# Patient Record
Sex: Male | Born: 1980 | Race: White | Hispanic: No | Marital: Married | Smoking: Current every day smoker
Health system: Southern US, Community
[De-identification: ages and names within clinical notes are randomized; demographics above are authoritative.]

## PROBLEM LIST (undated history)

## (undated) ENCOUNTER — Telehealth

## (undated) ENCOUNTER — Ambulatory Visit: Payer: MEDICARE

## (undated) ENCOUNTER — Ambulatory Visit

## (undated) ENCOUNTER — Encounter: Attending: Specialist | Primary: Specialist

## (undated) ENCOUNTER — Ambulatory Visit: Payer: MEDICARE | Attending: Family | Primary: Family

## (undated) ENCOUNTER — Telehealth: Attending: Family | Primary: Family

## (undated) ENCOUNTER — Encounter

## (undated) ENCOUNTER — Other Ambulatory Visit

## (undated) ENCOUNTER — Ambulatory Visit: Payer: Medicare (Managed Care)

## (undated) ENCOUNTER — Ambulatory Visit: Attending: Physician Assistant | Primary: Physician Assistant

## (undated) ENCOUNTER — Encounter: Attending: Family | Primary: Family

## (undated) HISTORY — PX: BACK SURGERY: SHX140

---

## 2018-02-06 DIAGNOSIS — S32010S Wedge compression fracture of first lumbar vertebra, sequela: Secondary | ICD-10-CM | POA: Insufficient documentation

## 2018-02-06 DIAGNOSIS — S32000A Wedge compression fracture of unspecified lumbar vertebra, initial encounter for closed fracture: Secondary | ICD-10-CM | POA: Insufficient documentation

## 2018-06-07 ENCOUNTER — Ambulatory Visit: Admit: 2018-06-07 | Discharge: 2018-06-08 | Payer: MEDICARE

## 2018-07-02 ENCOUNTER — Emergency Department: Payer: Medicare Other

## 2018-07-02 ENCOUNTER — Other Ambulatory Visit: Payer: Self-pay

## 2018-07-02 ENCOUNTER — Encounter: Payer: Self-pay | Admitting: Emergency Medicine

## 2018-07-02 ENCOUNTER — Emergency Department
Admission: EM | Admit: 2018-07-02 | Discharge: 2018-07-02 | Disposition: A | Discharge status: Home / Self Care | Payer: Medicare Other | Attending: Student in an Organized Health Care Education/Training Program | Admitting: Student in an Organized Health Care Education/Training Program

## 2018-07-02 DIAGNOSIS — T84226A Displacement of internal fixation device of vertebrae, initial encounter: Secondary | ICD-10-CM | POA: Insufficient documentation

## 2018-07-02 DIAGNOSIS — T84498A Other mechanical complication of other internal orthopedic devices, implants and grafts, initial encounter: Secondary | ICD-10-CM

## 2018-07-02 DIAGNOSIS — Y658 Other specified misadventures during surgical and medical care: Secondary | ICD-10-CM | POA: Diagnosis not present

## 2018-07-02 DIAGNOSIS — M48061 Spinal stenosis, lumbar region without neurogenic claudication: Secondary | ICD-10-CM

## 2018-07-02 DIAGNOSIS — F17210 Nicotine dependence, cigarettes, uncomplicated: Secondary | ICD-10-CM | POA: Insufficient documentation

## 2018-07-02 DIAGNOSIS — M5441 Lumbago with sciatica, right side: Secondary | ICD-10-CM | POA: Diagnosis not present

## 2018-07-02 DIAGNOSIS — M545 Low back pain: Secondary | ICD-10-CM | POA: Diagnosis present

## 2018-07-02 DIAGNOSIS — S32010A Wedge compression fracture of first lumbar vertebra, initial encounter for closed fracture: Secondary | ICD-10-CM

## 2018-07-02 DIAGNOSIS — G8929 Other chronic pain: Secondary | ICD-10-CM

## 2018-07-02 MED ORDER — METHOCARBAMOL 500 MG PO TABS: 500.0000 mg | ORAL_TABLET | Freq: Four times a day (QID) | ORAL | 0 refills | Status: DC

## 2018-07-02 MED ORDER — KETOROLAC TROMETHAMINE 30 MG/ML IJ SOLN
30.0000 mg | Freq: Once | INTRAMUSCULAR | Status: AC
  Administered 2018-07-02: 30 mg via INTRAMUSCULAR
  Filled 2018-07-02: qty 1

## 2018-07-02 MED ORDER — OXYCODONE-ACETAMINOPHEN 5-325 MG PO TABS
1.0000 | ORAL_TABLET | Freq: Once | ORAL | Status: AC
  Administered 2018-07-02: 1 via ORAL
  Filled 2018-07-02: qty 1

## 2018-07-02 MED ORDER — DEXAMETHASONE SODIUM PHOSPHATE 10 MG/ML IJ SOLN
10.0000 mg | Freq: Once | INTRAMUSCULAR | Status: AC
  Administered 2018-07-02: 10 mg via INTRAMUSCULAR
  Filled 2018-07-02: qty 1

## 2018-07-02 MED ORDER — MELOXICAM 15 MG PO TABS: 15.0000 mg | ORAL_TABLET | Freq: Every day | ORAL | 0 refills | Status: DC

## 2018-07-02 MED ORDER — HYDROMORPHONE HCL 1 MG/ML IJ SOLN
1.0000 mg | Freq: Once | INTRAMUSCULAR | Status: AC
  Administered 2018-07-02: 1 mg via INTRAMUSCULAR
  Filled 2018-07-02: qty 1

## 2018-07-02 MED ORDER — ORPHENADRINE CITRATE 30 MG/ML IJ SOLN
60.0000 mg | Freq: Once | INTRAMUSCULAR | Status: AC
  Administered 2018-07-02: 60 mg via INTRAMUSCULAR
  Filled 2018-07-02: qty 2

## 2018-07-02 MED ORDER — PREDNISONE 10 MG PO TABS: 10.0000 mg | ORAL_TABLET | Freq: Every day | ORAL | 0 refills | Status: DC
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
## 2018-07-02 NOTE — ED Notes (Signed)
Patient transported to MRI 

## 2019-03-05 DIAGNOSIS — S81801A Unspecified open wound, right lower leg, initial encounter: Principal | ICD-10-CM

## 2019-03-06 ENCOUNTER — Emergency Department: Admit: 2019-03-06 | Discharge: 2019-03-06 | Disposition: A | Discharge status: Home / Self Care | Payer: MEDICARE

## 2019-03-06 ENCOUNTER — Ambulatory Visit: Admit: 2019-03-06 | Discharge: 2019-03-06 | Disposition: A | Discharge status: Home / Self Care | Payer: MEDICARE

## 2019-06-07 ENCOUNTER — Ambulatory Visit
Admit: 2019-06-07 | Discharge: 2019-06-07 | Disposition: A | Discharge status: Home / Self Care | Payer: MEDICARE | Attending: Emergency Medicine

## 2019-06-07 ENCOUNTER — Emergency Department
Admit: 2019-06-07 | Discharge: 2019-06-07 | Disposition: A | Discharge status: Home / Self Care | Payer: MEDICARE | Attending: Emergency Medicine

## 2019-06-07 DIAGNOSIS — Z4801 Encounter for change or removal of surgical wound dressing: Secondary | ICD-10-CM

## 2019-06-07 DIAGNOSIS — L0390 Cellulitis, unspecified: Secondary | ICD-10-CM

## 2019-06-07 DIAGNOSIS — F17210 Nicotine dependence, cigarettes, uncomplicated: Secondary | ICD-10-CM

## 2019-06-07 MED ORDER — CLINDAMYCIN HCL 150 MG CAPSULE
ORAL_CAPSULE | Freq: Four times a day (QID) | ORAL | 0 refills | 7.00000 days | Status: CP
Start: 2019-06-07 — End: 2019-06-14

## 2019-06-19 IMAGING — CR DG LUMBAR SPINE COMPLETE 4+V
1 series · 5 of 5 positions shown · non-contrast
Comparison: None.

CLINICAL DATA: Worsening lumbar pain

EXAM:
LUMBAR SPINE - COMPLETE 4+ VIEW

[Series 1: dg lumbar spine complete 4 +v · 0.14mm/px · 5 of 5 slices shown]
[im 1/5]
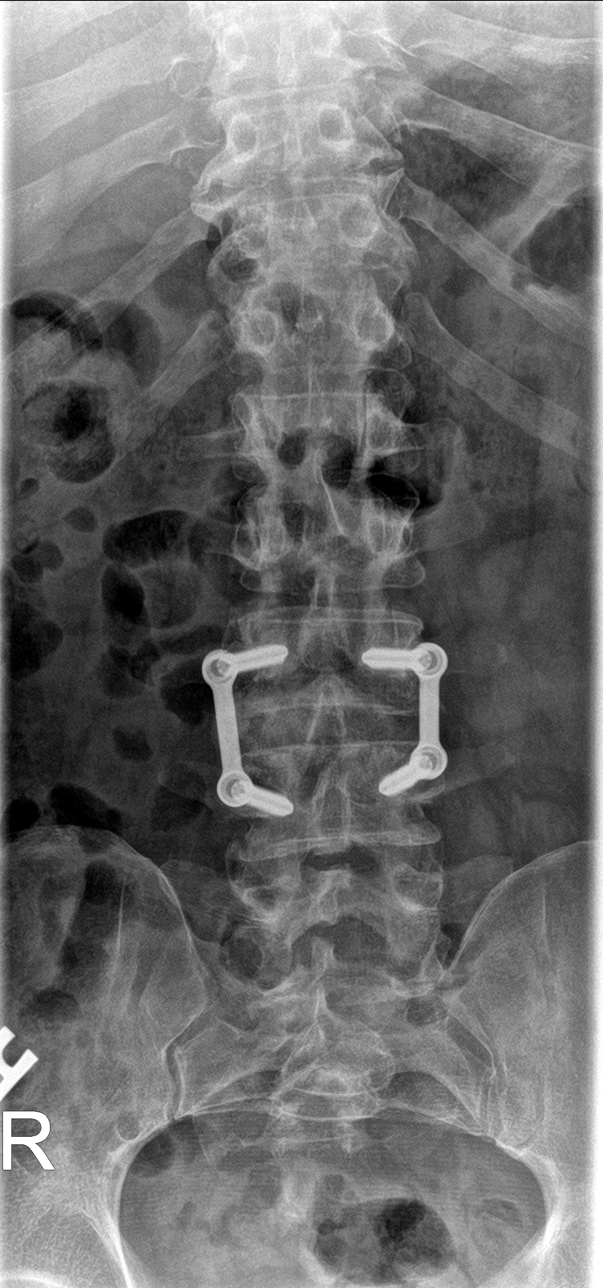
[im 2/5]
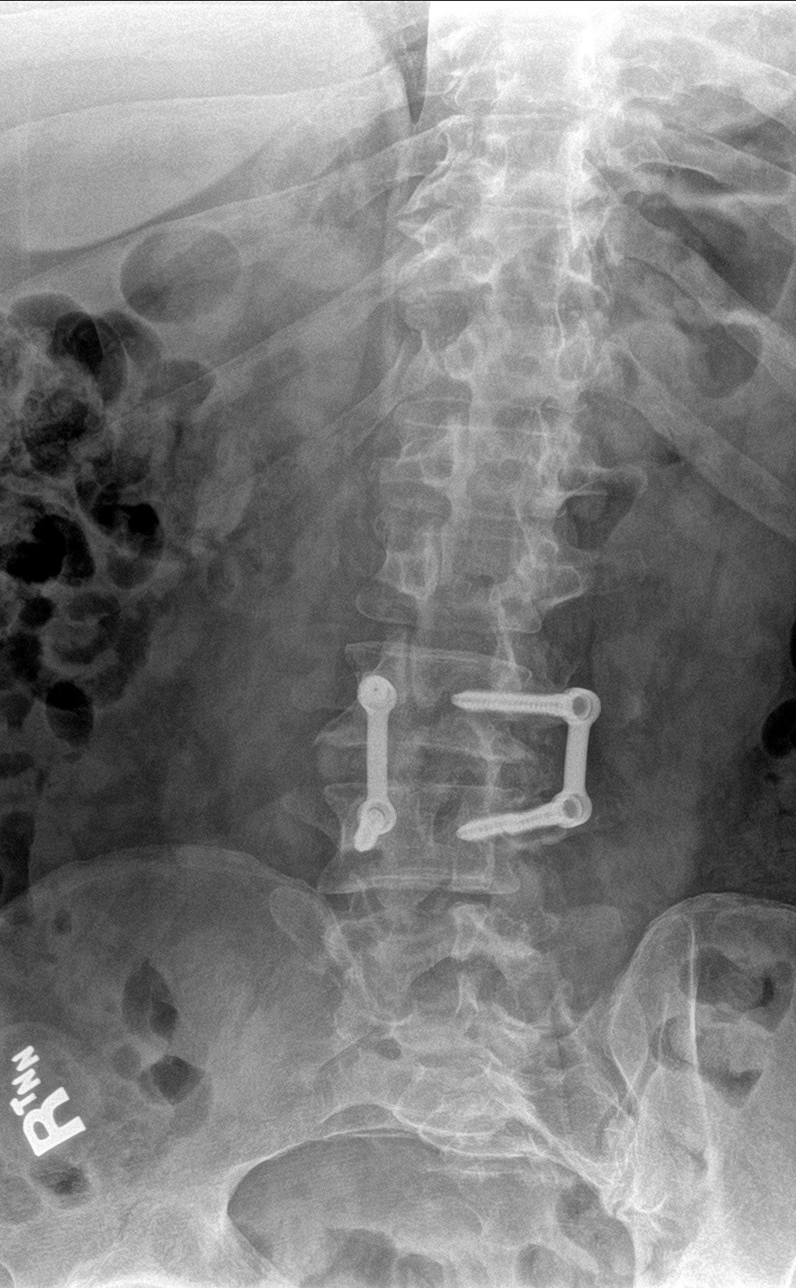
[im 3/5]
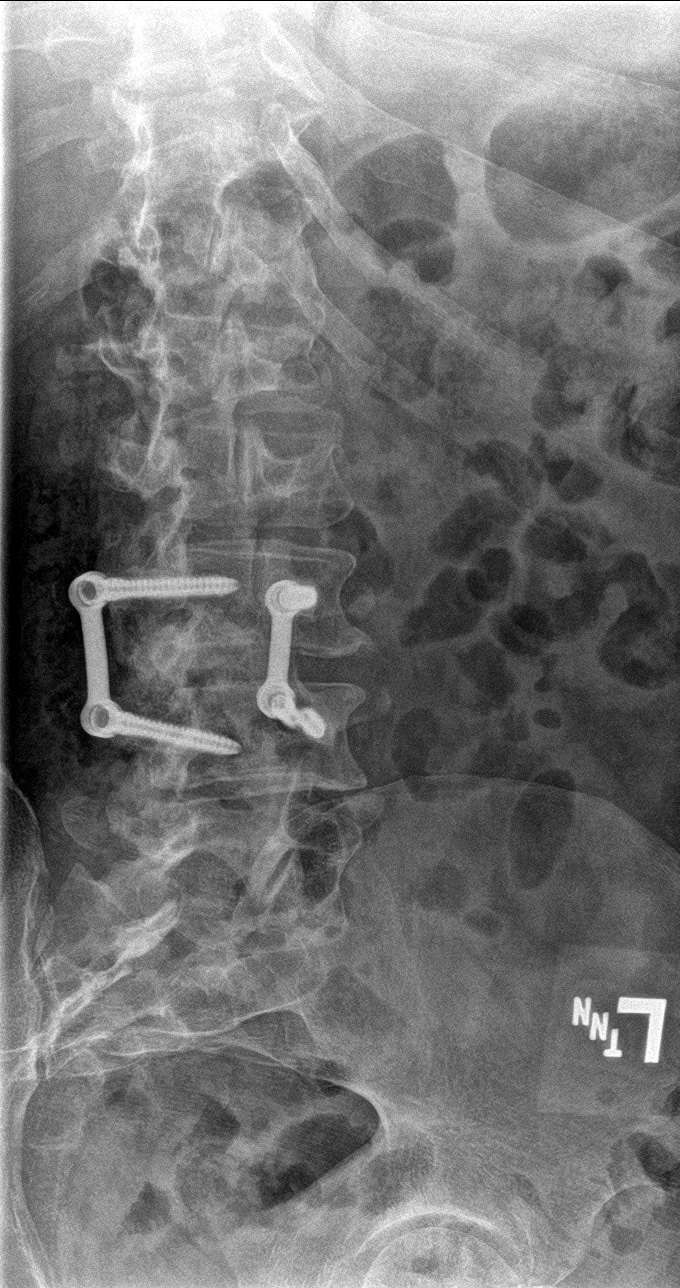
[im 4/5]
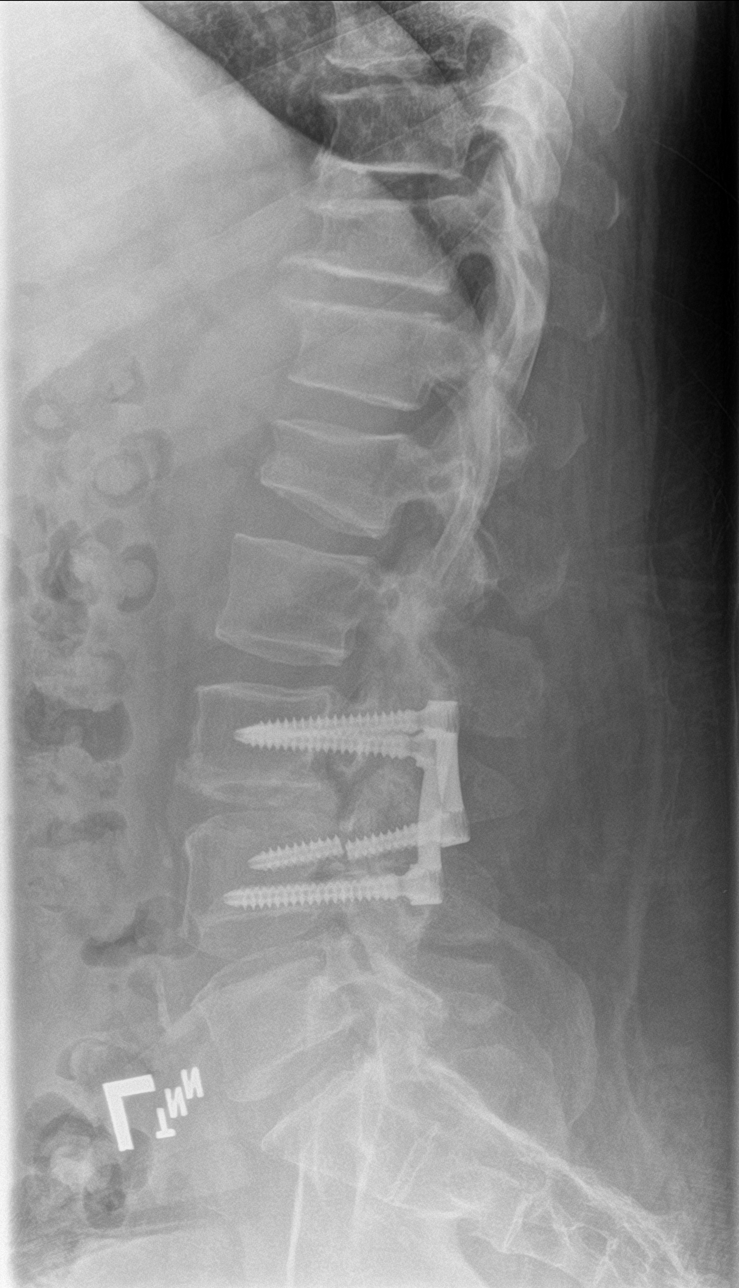
[im 5/5]
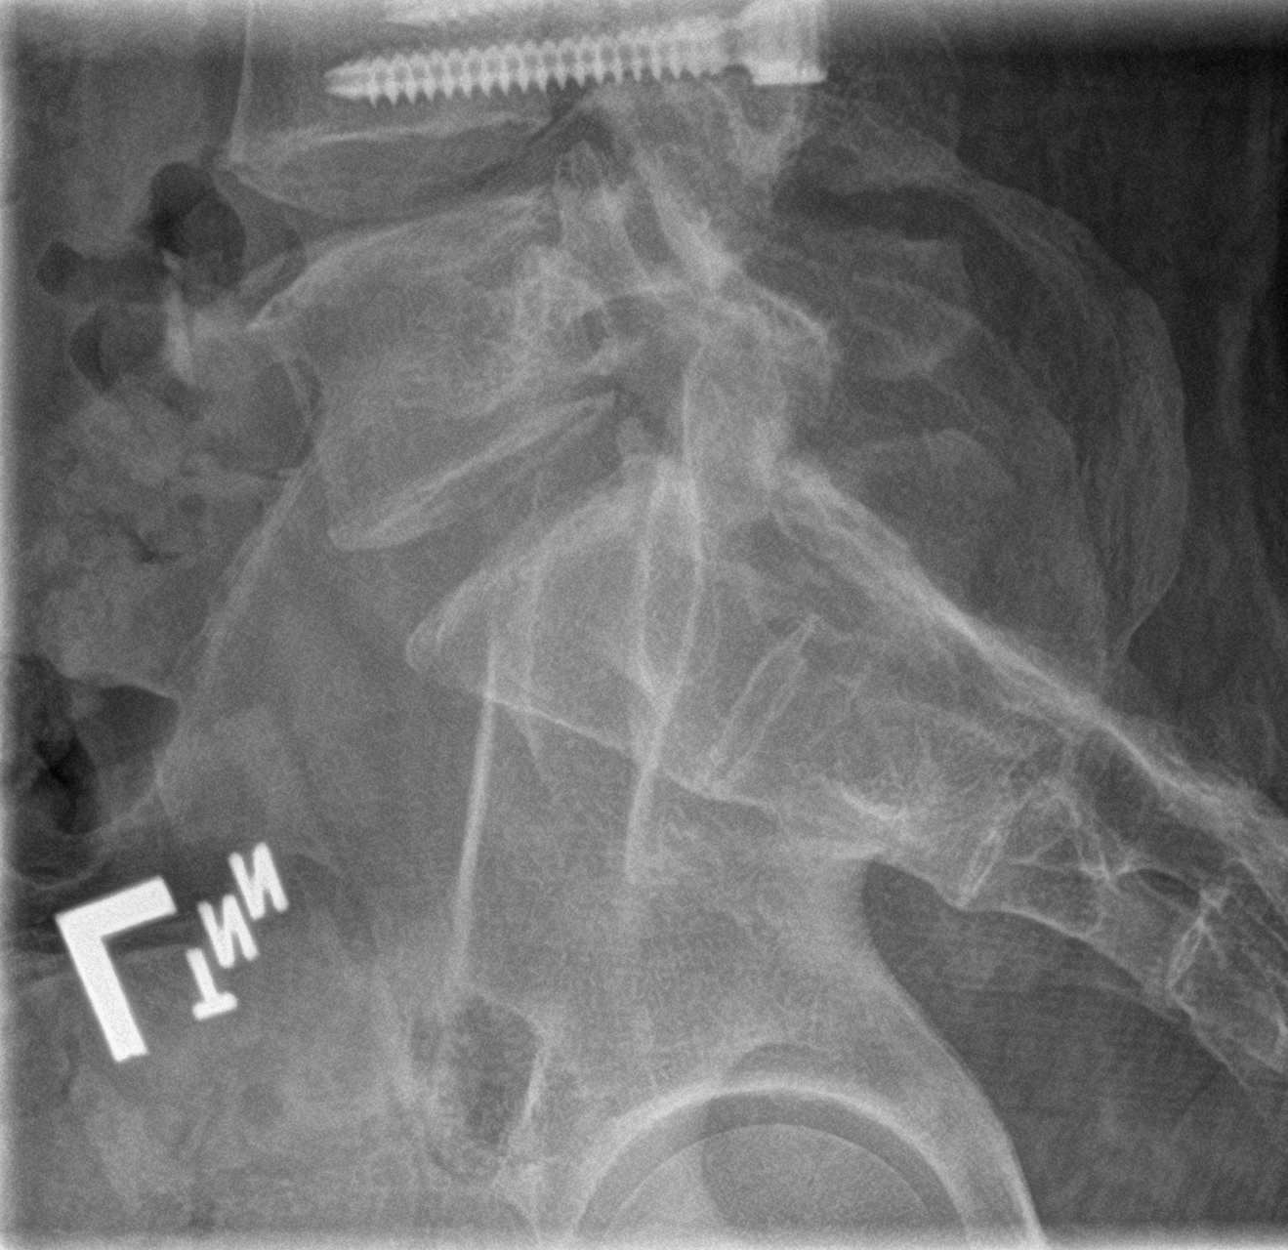

[5 of 5 positions shown; findings below may reference images not displayed]

FINDINGS: Five non rib-bearing lumbar type vertebra. Status post posterior
rods and screws at L3-L4. Fracture through 1 of the L4 fixating
screws, left-sided. Lucency around the left L4 screw consistent with
loosening. Mild wedge compression deformity L1, uncertain age.
Remaining lumbar vertebra demonstrate normal stature. Mild
degenerative change at L5-S1.
IMPRESSION: 1. Mild to moderate compression deformity L1 of uncertain age
2. Postsurgical changes at L3-L4 with fracture through left fixating
screw at L4, there is lucency surrounding the left L4 fixating
screw.

## 2020-07-30 DIAGNOSIS — G114 Hereditary spastic paraplegia: Secondary | ICD-10-CM | POA: Insufficient documentation

## 2020-07-30 DIAGNOSIS — E119 Type 2 diabetes mellitus without complications: Secondary | ICD-10-CM | POA: Insufficient documentation

## 2020-07-30 DIAGNOSIS — F419 Anxiety disorder, unspecified: Secondary | ICD-10-CM | POA: Insufficient documentation

## 2020-07-30 DIAGNOSIS — F17210 Nicotine dependence, cigarettes, uncomplicated: Secondary | ICD-10-CM | POA: Insufficient documentation

## 2021-04-21 DIAGNOSIS — E559 Vitamin D deficiency, unspecified: Secondary | ICD-10-CM | POA: Insufficient documentation

## 2021-04-21 DIAGNOSIS — S81801A Unspecified open wound, right lower leg, initial encounter: Secondary | ICD-10-CM | POA: Insufficient documentation

## 2021-04-21 DIAGNOSIS — S81802A Unspecified open wound, left lower leg, initial encounter: Secondary | ICD-10-CM | POA: Insufficient documentation

## 2021-04-21 DIAGNOSIS — G8929 Other chronic pain: Secondary | ICD-10-CM | POA: Insufficient documentation

## 2021-10-31 ENCOUNTER — Ambulatory Visit
Admit: 2021-10-31 | Discharge: 2021-11-01 | Disposition: A | Discharge status: Home / Self Care | Payer: MEDICARE | Attending: Emergency Medicine

## 2021-10-31 DIAGNOSIS — F1910 Other psychoactive substance abuse, uncomplicated: Principal | ICD-10-CM

## 2021-10-31 DIAGNOSIS — R7401 Transaminitis: Principal | ICD-10-CM

## 2021-10-31 DIAGNOSIS — E1065 Type 1 diabetes mellitus with hyperglycemia: Principal | ICD-10-CM

## 2021-10-31 DIAGNOSIS — T24212A Burn of second degree of left thigh, initial encounter: Principal | ICD-10-CM

## 2022-08-10 DIAGNOSIS — B1920 Unspecified viral hepatitis C without hepatic coma: Principal | ICD-10-CM

## 2022-09-08 ENCOUNTER — Ambulatory Visit: Admit: 2022-09-08 | Discharge: 2022-09-10 | Payer: MEDICARE

## 2022-09-08 DIAGNOSIS — L97909 Non-pressure chronic ulcer of unspecified part of unspecified lower leg with unspecified severity: Secondary | ICD-10-CM | POA: Insufficient documentation

## 2022-09-08 DIAGNOSIS — R739 Hyperglycemia, unspecified: Secondary | ICD-10-CM | POA: Insufficient documentation

## 2022-09-08 DIAGNOSIS — E1165 Type 2 diabetes mellitus with hyperglycemia: Secondary | ICD-10-CM | POA: Insufficient documentation

## 2022-09-08 DIAGNOSIS — L03119 Cellulitis of unspecified part of limb: Secondary | ICD-10-CM | POA: Insufficient documentation

## 2022-09-10 MED ORDER — INSULIN DETEMIR (U-100) 100 UNIT/ML (3 ML) SUBCUTANEOUS PEN
Freq: Every evening | SUBCUTANEOUS | 0 refills | 30 days | Status: CP
Start: 2022-09-10 — End: 2022-10-10

## 2022-09-10 MED ORDER — DOXYCYCLINE HYCLATE 100 MG CAPSULE
ORAL_CAPSULE | Freq: Two times a day (BID) | ORAL | 0 refills | 7 days | Status: CP
Start: 2022-09-10 — End: 2022-09-17

## 2022-09-10 MED ORDER — GLIPIZIDE 10 MG TABLET
ORAL_TABLET | Freq: Two times a day (BID) | ORAL | 0 refills | 30 days | Status: CP
Start: 2022-09-10 — End: 2022-10-10

## 2022-10-17 DIAGNOSIS — Z789 Other specified health status: Secondary | ICD-10-CM | POA: Insufficient documentation

## 2022-10-17 DIAGNOSIS — M899 Disorder of bone, unspecified: Secondary | ICD-10-CM | POA: Insufficient documentation

## 2022-10-17 DIAGNOSIS — G894 Chronic pain syndrome: Secondary | ICD-10-CM | POA: Insufficient documentation

## 2022-10-17 DIAGNOSIS — Z79899 Other long term (current) drug therapy: Secondary | ICD-10-CM | POA: Insufficient documentation
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
## 2022-10-18 ENCOUNTER — Ambulatory Visit (HOSPITAL_BASED_OUTPATIENT_CLINIC_OR_DEPARTMENT_OTHER): Payer: 59 | Admitting: Pain Medicine

## 2022-10-18 ENCOUNTER — Ambulatory Visit
Admission: RE | Admit: 2022-10-18 | Discharge: 2022-10-18 | Disposition: A | Discharge status: Home / Self Care | Payer: 59 | Source: Ambulatory Visit | Attending: Pain Medicine | Admitting: Pain Medicine

## 2022-10-18 ENCOUNTER — Encounter: Payer: Self-pay | Admitting: Pain Medicine

## 2022-10-18 VITALS — BP 147/86 | HR 102 | Temp 97.4°F | Resp 18 | Ht 67.0 in | Wt 175.0 lb

## 2022-10-18 DIAGNOSIS — M25612 Stiffness of left shoulder, not elsewhere classified: Secondary | ICD-10-CM

## 2022-10-18 DIAGNOSIS — G8929 Other chronic pain: Secondary | ICD-10-CM | POA: Insufficient documentation

## 2022-10-18 DIAGNOSIS — M5441 Lumbago with sciatica, right side: Secondary | ICD-10-CM

## 2022-10-18 DIAGNOSIS — F40298 Other specified phobia: Secondary | ICD-10-CM

## 2022-10-18 DIAGNOSIS — M5442 Lumbago with sciatica, left side: Secondary | ICD-10-CM

## 2022-10-18 DIAGNOSIS — G8911 Acute pain due to trauma: Secondary | ICD-10-CM

## 2022-10-18 DIAGNOSIS — M25512 Pain in left shoulder: Secondary | ICD-10-CM | POA: Diagnosis present

## 2022-10-18 DIAGNOSIS — M961 Postlaminectomy syndrome, not elsewhere classified: Secondary | ICD-10-CM | POA: Insufficient documentation

## 2022-10-18 DIAGNOSIS — R0781 Pleurodynia: Secondary | ICD-10-CM

## 2022-10-18 DIAGNOSIS — R0789 Other chest pain: Secondary | ICD-10-CM

## 2022-10-18 DIAGNOSIS — R937 Abnormal findings on diagnostic imaging of other parts of musculoskeletal system: Secondary | ICD-10-CM | POA: Insufficient documentation

## 2022-10-18 DIAGNOSIS — Z981 Arthrodesis status: Secondary | ICD-10-CM | POA: Insufficient documentation

## 2022-10-18 DIAGNOSIS — Z79899 Other long term (current) drug therapy: Secondary | ICD-10-CM | POA: Insufficient documentation

## 2022-10-18 DIAGNOSIS — S32010S Wedge compression fracture of first lumbar vertebra, sequela: Secondary | ICD-10-CM

## 2022-10-18 DIAGNOSIS — G114 Hereditary spastic paraplegia: Secondary | ICD-10-CM

## 2022-10-18 DIAGNOSIS — T50905S Adverse effect of unspecified drugs, medicaments and biological substances, sequela: Secondary | ICD-10-CM | POA: Insufficient documentation

## 2022-10-18 DIAGNOSIS — M899 Disorder of bone, unspecified: Secondary | ICD-10-CM | POA: Insufficient documentation

## 2022-10-18 DIAGNOSIS — G894 Chronic pain syndrome: Secondary | ICD-10-CM

## 2022-10-18 DIAGNOSIS — M79605 Pain in left leg: Secondary | ICD-10-CM

## 2022-10-18 DIAGNOSIS — M79604 Pain in right leg: Secondary | ICD-10-CM

## 2022-10-18 DIAGNOSIS — F1190 Opioid use, unspecified, uncomplicated: Secondary | ICD-10-CM | POA: Insufficient documentation

## 2022-10-18 DIAGNOSIS — Z789 Other specified health status: Secondary | ICD-10-CM

## 2022-10-18 DIAGNOSIS — R252 Cramp and spasm: Secondary | ICD-10-CM | POA: Insufficient documentation

## 2022-10-18 NOTE — Patient Instructions (Signed)
____________________________________________________________________________________________  New Patients  Welcome to [facility] [place].   Initial Visit The first or initial visit consists of an evaluation only.   Interventional pain management.  We offer therapies other than opioid controlled substances to manage chronic pain. These include, but are not limited to, diagnostic, therapeutic, and palliative specialized injection therapies (i.e.: Epidural Steroids, Facet Blocks, etc.). We specialize in a variety of nerve blocks as well as radiofrequency treatments. We offer pain implant evaluations and trials, as well as follow up management. In addition we also provide a variety joint injections, including Viscosupplementation (AKA: Gel Therapy).  Prescription Pain Medication. We specialize in alternatives to opioids. We can provide evaluations and recommendations for/of pharmacologic therapies based on CDC Guidelines.  We no longer take patients for long-term medication management. We will not be taking over your pain medications.  ____________________________________________________________________________________________    ____________________________________________________________________________________________  Patient Information update  To: All of our patients.  Re: Name change.  It has been made official that our current name, "[facility]"   will soon be changed to "[facility]".   The purpose of this change is to eliminate any confusion created by the concept of our practice being a "Medication Management Pain [facility]". In the past this has led to the misconception that we treat pain primarily by the use of prescription medications.  Nothing can be farther from the truth.   Understanding PAIN MANAGEMENT: To  further understand what our practice does, you first have to understand that "Pain Management" is a subspecialty that requires additional training once a physician has completed their specialty training, which can be in either Anesthesia, Neurology, Psychiatry, or Physical Medicine and Rehabilitation (PMR). Each one of these contributes to the final approach taken by each physician to the management of their patient's pain. To be a "Pain Management Specialist" you must have first completed one of the specialty trainings below.  Anesthesiologists - trained in clinical pharmacology and interventional techniques such as nerve blockade and regional as well as central neuroanatomy. They are trained to block pain before, during, and after surgical interventions.  Neurologists - trained in the diagnosis and pharmacological treatment of complex neurological conditions, such as Multiple Sclerosis, Parkinson's, spinal cord injuries, and other systemic conditions that may be associated with symptoms that may include but are not limited to pain. They tend to rely primarily on the treatment of chronic pain using prescription medications.  Psychiatrist - trained in conditions affecting the psychosocial wellbeing of patients including but not limited to depression, anxiety, schizophrenia, personality disorders, addiction, and other substance use disorders that may be associated with chronic pain. They tend to rely primarily on the treatment of chronic pain using prescription medications.   Physical Medicine and Rehabilitation (PMR) physicians, also known as physiatrists - trained to treat a wide variety of medical conditions affecting the brain, spinal cord, nerves, bones, joints, ligaments, muscles, and tendons. Their training is primarily aimed at treating patients that have suffered injuries that have caused severe physical impairment. Their training is primarily aimed at the physical therapy and rehabilitation of those  patients. They may also work alongside orthopedic surgeons or neurosurgeons using their expertise in assisting surgical patients to recover after their surgeries.  INTERVENTIONAL PAIN MANAGEMENT is sub-subspecialty of Pain Management.  Our physicians are Board-certified in Anesthesia, Pain Management, and Interventional Pain Management.  This meaning that not only have they been trained  and Board-certified in their specialty of Anesthesia, and subspecialty of Pain Management, but they have also received further training in the sub-subspecialty of Interventional Pain Management, in order to become Board-certified as INTERVENTIONAL PAIN MANAGEMENT SPECIALIST.    Mission: Our goal is to use our skills in  [place] as alternatives to the chronic use of prescription opioid medications for the treatment of pain. To make this more clear, we have changed our name to reflect what we do and offer. We will continue to offer medication management assessment and recommendations, but we will not be taking over any patient's medication management.  ____________________________________________________________________________________________     ____________________________________________________________________________________________  General Risks and Possible Complications  Patient Responsibilities: It is important that you read this as it is part of your informed consent. It is our duty to inform you of the risks and possible complications associated with treatments offered to you. It is your responsibility as a patient to read this and to ask questions about anything that is not clear or that you believe was not covered in this document.  Patient's Rights: You have the right to refuse treatment. You also have the right to change your mind, even after initially having agreed to have the treatment done. However, under this last option, if you wait until the last second to change your mind,  you may be charged for the materials used up to that point.  Introduction: Medicine is not an Chief Strategy Officer. Everything in Medicine, including the lack of treatment(s), carries the potential for danger, harm, or loss (which is by definition: Risk). In Medicine, a complication is a secondary problem, condition, or disease that can aggravate an already existing one. All treatments carry the risk of possible complications. The fact that a side effects or complications occurs, does not imply that the treatment was conducted incorrectly. It must be clearly understood that these can happen even when everything is done following the highest safety standards.  No treatment: You can choose not to proceed with the proposed treatment alternative. The "PRO(s)" would include: avoiding the risk of complications associated with the therapy. The "CON(s)" would include: not getting any of the treatment benefits. These benefits fall under one of three categories: diagnostic; therapeutic; and/or palliative. Diagnostic benefits include: getting information which can ultimately lead to improvement of the disease or symptom(s). Therapeutic benefits are those associated with the successful treatment of the disease. Finally, palliative benefits are those related to the decrease of the primary symptoms, without necessarily curing the condition (example: decreasing the pain from a flare-up of a chronic condition, such as incurable terminal cancer).  General Risks and Complications: These are associated to most interventional treatments. They can occur alone, or in combination. They fall under one of the following six (6) categories: no benefit or worsening of symptoms; bleeding; infection; nerve damage; allergic reactions; and/or death. No benefits or worsening of symptoms: In Medicine there are no guarantees, only probabilities. No healthcare provider can ever guarantee that a medical treatment will work, they can only state the  probability that it may. Furthermore, there is always the possibility that the condition may worsen, either directly, or indirectly, as a consequence of the treatment. Bleeding: This is more common if the patient is taking a blood thinner, either prescription or over the counter (example: Goody Powders, Fish oil, Aspirin, Garlic, etc.), or if suffering a condition associated with impaired coagulation (example: Hemophilia, cirrhosis of the liver, low platelet counts, etc.). However, even if you do not have  one on these, it can still happen. If you have any of these conditions, or take one of these drugs, make sure to notify your treating physician. Infection: This is more common in patients with a compromised immune system, either due to disease (example: diabetes, cancer, human immunodeficiency virus [HIV], etc.), or due to medications or treatments (example: therapies used to treat cancer and rheumatological diseases). However, even if you do not have one on these, it can still happen. If you have any of these conditions, or take one of these drugs, make sure to notify your treating physician. Nerve Damage: This is more common when the treatment is an invasive one, but it can also happen with the use of medications, such as those used in the treatment of cancer. The damage can occur to small secondary nerves, or to large primary ones, such as those in the spinal cord and brain. This damage may be temporary or permanent and it may lead to impairments that can range from temporary numbness to permanent paralysis and/or brain death. Allergic Reactions: Any time a substance or material comes in contact with our body, there is the possibility of an allergic reaction. These can range from a mild skin rash (contact dermatitis) to a severe systemic reaction (anaphylactic reaction), which can result in death. Death: In general, any medical intervention can result in death, most of the time due to an unforeseen  complication. ____________________________________________________________________________________________

## 2022-10-19 ENCOUNTER — Telehealth: Payer: Self-pay | Admitting: *Deleted

## 2022-10-19 NOTE — Telephone Encounter (Signed)
Spoke with patient regarding critical glucose level of 548. Per Dr. Dossie Arbour he needs to see PCP today. States he will call Elsie Sa[facility] PCP for appointment.

## 2022-10-20 LAB — COMPLIANCE DRUG ANALYSIS, UR

## 2022-10-27 LAB — COMP. METABOLIC PANEL (12)
AST: 122 IU/L — ABNORMAL HIGH (ref 0–40)
Albumin/Globulin Ratio: 1.3 (ref 1.2–2.2)
Albumin: 3.8 g/dL — ABNORMAL LOW (ref 4.1–5.1)
Alkaline Phosphatase: 161 IU/L — ABNORMAL HIGH (ref 44–121)
BUN/Creatinine Ratio: 22 — ABNORMAL HIGH (ref 9–20)
BUN: 15 mg/dL (ref 6–24)
Bilirubin Total: 0.4 mg/dL (ref 0.0–1.2)
Calcium: 9.7 mg/dL (ref 8.7–10.2)
Chloride: 99 mmol/L (ref 96–106)
Creatinine, Ser: 0.67 mg/dL — ABNORMAL LOW (ref 0.76–1.27)
Globulin, Total: 2.9 g/dL (ref 1.5–4.5)
Glucose: 548 mg/dL (ref 70–99)
Potassium: 4.8 mmol/L (ref 3.5–5.2)
Sodium: 132 mmol/L — ABNORMAL LOW (ref 134–144)
Total Protein: 6.7 g/dL (ref 6.0–8.5)
eGFR: 119 mL/min/{1.73_m2} (ref >59)

## 2022-10-27 LAB — 25-HYDROXY VITAMIN D LCMS D2+D3
25-Hydroxy, Vitamin D-2: <1 ng/mL
25-Hydroxy, Vitamin D-3: 14 ng/mL
25-Hydroxy, Vitamin D: 14 ng/mL — ABNORMAL LOW

## 2022-10-27 LAB — MAGNESIUM: Magnesium: 1.9 mg/dL (ref 1.6–2.3)

## 2022-10-27 LAB — C-REACTIVE PROTEIN: CRP: <1 mg/L (ref 0–10)

## 2022-10-27 LAB — SEDIMENTATION RATE: Sed Rate: 5 mm/hr (ref 0–15)

## 2022-10-27 LAB — VITAMIN B12: Vitamin B-12: 1077 pg/mL (ref 232–1245)

## 2022-10-31 ENCOUNTER — Ambulatory Visit: Admit: 2022-10-31 | Discharge: 2022-11-06 | Payer: MEDICARE

## 2022-10-31 ENCOUNTER — Ambulatory Visit: Admit: 2022-10-31 | Discharge: 2022-11-06 | Disposition: A | Discharge status: Home Health | Payer: MEDICARE

## 2022-10-31 DIAGNOSIS — R892 Abnormal level of other drugs, medicaments and biological substances in specimens from other organs, systems and tissues: Secondary | ICD-10-CM | POA: Insufficient documentation

## 2022-10-31 NOTE — Progress Notes (Unsigned)
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 lispro (HUMALOG) 100 UNIT/ML KwikPen, Inject into the skin., Disp: , Rfl:    Insulin Pen Needle (BD PEN NEEDLE NANO U/F) 32G X 4 MM MISC, 1 each 4 (four) times a day., Disp: , Rfl:    Insulin Pen Needle (ULTIGUARD SAFEPACK PEN NEEDLE) 32G X 4 MM MISC, [The details of the medication are not available because there are pending changes by a home health clinician.], Disp: , Rfl:   ROS  Constitutional: Denies any fever or chills Gastrointestinal: No reported hemesis, hematochezia, vomiting, or acute GI distress Musculoskeletal: Denies  any acute onset joint swelling, redness, loss of ROM, or weakness Neurological: No reported episodes of acute onset apraxia, aphasia, dysarthria, agnosia, amnesia, paralysis, loss of coordination, or loss of consciousness  Allergies  Mr. Dunagan has No Known Allergies.  [place]  Drug: Mr. Schwartzenberge  reports current drug use. Drug: Marijuana. Alcohol:  reports current alcohol use. Tobacco:  reports  that he has been smoking cigarettes. He has been smoking an average of 2 packs per day. He does not have any smokeless tobacco history on file. Medical:  has no past medical history on file. Surgical: Mr. Salb  has a past surgical history that includes Back surgery. Family: family history is not on file.  Constitutional Exam  General appearance: Well nourished, well developed, and well hydrated. In no apparent acute distress There were no vitals filed for this visit. BMI Assessment: Estimated body mass index is 27.41 kg/m as calculated from the following:   Height as of 10/18/22: '5\' 7"'$  (1.702 m).   Weight as of 10/18/22: 175 lb (79.4 kg).  BMI interpretation table: BMI level Category Range association with higher incidence of chronic pain  <18 kg/m2 Underweight   18.5-24.9 kg/m2 Ideal body weight   25-29.9 kg/m2 Overweight Increased incidence by 20%  30-34.9 kg/m2 Obese (Class I) Increased incidence by 68%  35-39.9 kg/m2 Severe obesity (Class II) Increased incidence by 136%  >40 kg/m2 Extreme obesity (Class III) Increased incidence by 254%   Patient's current BMI Ideal Body weight  There is no height or weight on file to calculate BMI. Ideal body weight: 66.1 kg (145 lb 11.6 oz) Adjusted ideal body weight: 71.4 kg (157 lb 6.9 oz)   BMI Readings from Last 4 Encounters:  10/18/22 27.41 kg/m  07/02/18 37.12 kg/m   Wt Readings from Last 4 Encounters:  10/18/22 175 lb (79.4 kg)  07/02/18 230 lb (104.3 kg)    Psych/Mental status: Alert, oriented x 3 (person, place, & time)       Eyes: PERLA Respiratory: No evidence of acute respiratory distress  Assessment & Plan  Primary Diagnosis & Pertinent Problem List: The primary encounter diagnosis was Chronic pain syndrome. Diagnoses of Chronic low back pain (1ry area of Pain) (Bilateral) (R>L) w/ sciatica (Bilateral), Chronic lower extremity pain (2ry area of Pain) (Bilateral), Failed back surgical syndrome, Compression fracture of L1  lumbar vertebra, sequela, and Hereditary spastic paraplegia (HCC) were also pertinent to this visit.  Visit Diagnosis: 1. Chronic pain syndrome   2. Chronic low back pain (1ry area of Pain) (Bilateral) (R>L) w/ sciatica (Bilateral)   3. Chronic lower extremity pain (2ry area of Pain) (Bilateral)   4. Failed back surgical syndrome   5. Compression fracture of L1 lumbar vertebra, sequela   6. Hereditary spastic paraplegia (HCC)    Problems updated and reviewed during this visit: No problems updated.  Plan of Care  Pharmacotherapy (Medications Ordered): No orders of the defined types were placed in this encounter.  Procedure Orders    No procedure(s) ordered today   Lab Orders  No laboratory test(s) ordered today   Imaging Orders  No imaging studies ordered today   Referral Orders  No referral(s) requested today    Pharmacological management:  Opioid Analgesics: I will not be prescribing any opioids at this time Membrane stabilizer: I will not be prescribing any at this time Muscle relaxant: I will not be prescribing any at this time NSAID: I will not be prescribing any at this time Other analgesic(s): I will not  be prescribing any at this time      Interventional Therapies  Risk Factors  Considerations:     Planned  Pending:   Does not want any procedures   Under consideration:      Completed:   None   Completed by other providers:   Unknown  unconfirmed   Therapeutic  Palliative (PRN) options:   None   Pharmacotherapy impression and recommendations:  Although the patient presents with pathology that would be appropriate for the use of controlled substances, he has a longstanding history of noncompliance with therapy has seen by his uncontrolled diabetes.  In addition, he has a history of multiple urine drug screening test that have been positive for illicit substances, which at the time of our interview with the patient he neglected to inform us of their  existence.  Furthermore during the ORT he indicated no history of using any illicit substances.  This would suggest the patient to be at a very high risk for substance use disorder.  I would suggest the management of his opioid analgesics to be conducted by an addiction specialist that can regularly monitor the patient's use.       Provider-requested follow-up: No follow-ups on file. Recent Visits Date Type Provider Dept  10/18/22 Office Visit Milinda Pointer, MD [facility]  Showing recent visits within past 90 days and meeting all other requirements Future Appointments Date Type Provider Dept  11/01/22 Appointment Milinda Pointer, MD [facility]  Showing future appointments within next 90 days and meeting all other requirements   Primary Care Physician: Elsie Saas, MD  Duration of encounter: *** minutes.  Total time on encounter, as per AMA guidelines included both the face-to-face and non-face-to-face time personally spent by the physician and/or other qualified health care professional(s) on the day of the encounter (includes time in activities that require the physician or other qualified health care professional and does not include time in activities normally performed by clinical staff). Physician's time may include the following activities when performed: Preparing to see the patient (e.g., pre-charting review of records, searching for previously ordered imaging, lab work, and nerve conduction tests) Review of prior analgesic pharmacotherapies. Reviewing PMP Interpreting ordered tests (e.g., lab work, imaging, nerve conduction tests) Performing post-procedure evaluations, including interpretation of diagnostic procedures Obtaining and/or reviewing separately obtained history Performing a medically appropriate examination and/or evaluation Counseling and educating the patient/family/caregiver Ordering medications, tests, or procedures Referring and  communicating with other health care professionals (when not separately reported) Documenting clinical information in the electronic or other health record Independently interpreting results (not separately reported) and communicating results to the patient/ family/caregiver Care coordination (not separately reported)  Note by: Gaspar Cola, MD (TTS technology used. I apologize for any typographical errors that were not detected and corrected.) Date: 11/01/2022; Time: 4:10 PM

## 2022-11-01 ENCOUNTER — Ambulatory Visit (HOSPITAL_BASED_OUTPATIENT_CLINIC_OR_DEPARTMENT_OTHER): Payer: 59 | Admitting: Pain Medicine

## 2022-11-01 DIAGNOSIS — R892 Abnormal level of other drugs, medicaments and biological substances in specimens from other organs, systems and tissues: Secondary | ICD-10-CM

## 2022-11-01 DIAGNOSIS — S32010S Wedge compression fracture of first lumbar vertebra, sequela: Secondary | ICD-10-CM

## 2022-11-01 DIAGNOSIS — G114 Hereditary spastic paraplegia: Secondary | ICD-10-CM

## 2022-11-01 DIAGNOSIS — G894 Chronic pain syndrome: Secondary | ICD-10-CM

## 2022-11-01 DIAGNOSIS — G8929 Other chronic pain: Secondary | ICD-10-CM

## 2022-11-01 DIAGNOSIS — M961 Postlaminectomy syndrome, not elsewhere classified: Secondary | ICD-10-CM

## 2022-11-01 DIAGNOSIS — Z91199 Patient's noncompliance with other medical treatment and regimen due to unspecified reason: Secondary | ICD-10-CM

## 2022-11-06 MED ORDER — LINEZOLID 600 MG TABLET
ORAL_TABLET | Freq: Two times a day (BID) | ORAL | 0 refills | 10 days | Status: CP
Start: 2022-11-06 — End: 2022-11-16

## 2022-11-06 MED ORDER — OXYCODONE 10 MG TABLET
ORAL_TABLET | ORAL | 0 refills | 7 days | Status: CP | PRN
Start: 2022-11-06 — End: 2022-11-16

## 2022-11-22 ENCOUNTER — Ambulatory Visit: Admit: 2022-11-22 | Payer: MEDICARE | Attending: Family | Primary: Family

## 2022-12-04 ENCOUNTER — Ambulatory Visit: Admit: 2022-12-04 | Discharge: 2022-12-05 | Disposition: A | Discharge status: Home Health | Payer: MEDICARE

## 2022-12-05 MED ORDER — LINEZOLID 600 MG TABLET
ORAL_TABLET | Freq: Two times a day (BID) | ORAL | 0 refills | 10 days | Status: CP
Start: 2022-12-05 — End: 2022-12-15

## 2022-12-05 MED ORDER — OXYCODONE 10 MG TABLET
ORAL_TABLET | ORAL | 0 refills | 7 days | Status: CP | PRN
Start: 2022-12-05 — End: 2022-12-10

## 2022-12-05 MED ORDER — LACTOBACIL RHAMNOSUS GG 10 BILLION CELL-INULIN 200 MG SPRINKLE CAPSULE
ORAL_CAPSULE | Freq: Every day | ORAL | 0 refills | 30 days | Status: CP
Start: 2022-12-05 — End: 2023-01-04

## 2022-12-14 ENCOUNTER — Ambulatory Visit: Admit: 2022-12-14 | Discharge: 2022-12-15 | Payer: MEDICARE | Attending: Family | Primary: Family

## 2022-12-14 ENCOUNTER — Ambulatory Visit: Admit: 2022-12-14 | Discharge: 2022-12-15 | Payer: MEDICARE

## 2022-12-14 DIAGNOSIS — Z1159 Encounter for screening for other viral diseases: Principal | ICD-10-CM

## 2022-12-14 DIAGNOSIS — F1991 History of drug use: Principal | ICD-10-CM

## 2022-12-14 DIAGNOSIS — Z114 Encounter for screening for human immunodeficiency virus [HIV]: Principal | ICD-10-CM

## 2022-12-14 DIAGNOSIS — K738 Other chronic hepatitis, not elsewhere classified: Principal | ICD-10-CM

## 2022-12-14 DIAGNOSIS — M62838 Other muscle spasm: Principal | ICD-10-CM

## 2022-12-14 DIAGNOSIS — L03116 Cellulitis of left lower limb: Principal | ICD-10-CM

## 2022-12-14 DIAGNOSIS — B189 Chronic viral hepatitis, unspecified: Principal | ICD-10-CM

## 2022-12-19 DIAGNOSIS — B189 Chronic viral hepatitis, unspecified: Principal | ICD-10-CM

## 2022-12-19 MED ORDER — SOFOSBUVIR 400 MG-VELPATASVIR 100 MG TABLET
ORAL_TABLET | Freq: Every day | ORAL | 2 refills | 28 days | Status: CP
Start: 2022-12-19 — End: 2023-03-19
  Filled 2022-12-25: qty 28, 28d supply, fill #0

## 2022-12-20 DIAGNOSIS — B189 Chronic viral hepatitis, unspecified: Principal | ICD-10-CM

## 2022-12-22 NOTE — Unmapped (Signed)
[facility]   Patient Onboarding/Medication Counseling    Mr.Guy is a 42 y.o. male with Hepatitis C who I am counseling today on initiation of therapy.  I am speaking to the patient.    Was a Nurse, learning disability used for this call? No    Verified patient's date of birth / HIPAA.    Specialty medication(s) to be sent: Infectious Disease: sofosbuvir/velpatasvir      Non-specialty medications/supplies to be sent: n/a      Medications not needed at this time: n/a         Epclusa (sofosbuvir and velpatasvir) 400mg /100mg     Planned Start Date: 12/25/22      Medication & Administration     Dosage: Take one tablet by mouth daily for 12 weeks.    Administration:  Take with or without food.  Antacids: Separate the administration of Epclusa and antacids by at least 4 hours.  H2 Receptor Antagonist: Administer H2 receptor antagonist doses less than or comparable to famotidine 40 mg twice daily simultaneously or 12 hours apart from [place].   Proton Pump Inhibitor (PPI): Epclusa should be administered with food and taken 4 hours before omeprazole max dose of 20mg .      Adherence/Missed dose instructions:  Take missed dose as soon as you remember. If it is close to the time of your next dose, skip the missed dose and resume with your next scheduled dose.  Do not take extra doses or 2 doses at the same time.  Avoid missing doses as it can result in development of resistance.    Goals of Therapy     The goal of therapy is to cure the patient of Hepatitis C. Patients who have undetectable HCV RNA in the serum, as assessed by a sensitive polymerase chain reaction (PCR) assay, ?12 weeks after treatment completion are deemed to have achieved SVR (cure). (www.hcvguidelines.org)    Side Effects & Monitoring Parameters     Common Side Effects:   Headache  Fatigue     To help minimize some of the side effects: (http://www.velazquez.com/)  Stay hydrated by drinking plenty of water and limiting caffeinated beverages such as coffee, tea and soda.  Do your hardest chores when you have the most energy.  Take short naps of 20 minutes or less that are and not too close to bedtime.    The following side effects should be reported to the provider:  Signs of liver problems like dark urine, feeling excessively tired, lack of hunger, upset stomach or stomach pain, light-colored stools, throwing up, or yellow skin/eyes.  Signs of an allergic reaction, such as rash; hives; itching; red, swollen, blistered, or peeling skin with or without fever. If you have wheezing; tightness in the chest or throat; trouble breathing, swallowing, or talking; unusual hoarseness; or swelling of the mouth, face, lips, tongue, or throat, call 911 or go to the closest emergency department (ED).     Monitoring Parameters: (AASLD-IDSA Guidelines)    Within 6 months prior to starting treatment:  Complete blood count (CBC).  International normalized ratio (INR) if clinically indicated.   Hepatic function panel: albumin, total and direct bilirubin, ALT, AST, and Alkaline Phosphatase levels.  Calculated glomerular filtration rate (eGFR).  Pregnancy test within 1 month of starting treatment for females of childbearing age  Any time prior to starting treatment:  Test for HCV genotype.  Assess for hepatic fibrosis.  Quantitative HCV RNA (HCV viral load).  Assess for active HBV coinfection: HBV surface  antigen (HBsAg); If HBsAg positive, then should be assessed for whether HBV DNA level meets AASLD criteria for HBV treatment.  Assess for evidence of prior HBV infection and immunity: HBV core antibody (anti-HBc) and HBV surface antibody (anti-HBS).   Assess for HIV coinfection.  Test for the presence of resistance-associated substitutions (RASs) prior to starting treatment if clinically indicated.    Monitoring during treatment:   Diabetics should monitor for signs of hypoglycemia.  Patients on warfarin should monitor for changes in INR.  Pregnancy test monthly in females of childbearing age  For HBsAg positive patients not already on HBV therapy because baseline HBV DNA level does not meet treatment criteria:   Initiate prophylactic HBV antiviral therapy OR  Monitor HBV DNA levels monthly during and immediately after Epclusa therapy.    After treatment to document a cure:   Quantitative HCV RNA12 weeks after completion of therapy.    Contraindications, Warnings, & Precautions     Hepatitis B reactivation.  Bradycardia with amiodarone coadministration: Coadministration not recommended. If unavoidable, patients should have inpatient cardiac monitoring for 48 hours after first Epclusa dose, followed by daily outpatient or self-monitoring of heart rate for at least the first 2 weeks of treatment. Due to the long half-life of amiodarone, cardiac monitoring is also recommended if amiodarone was discontinued just prior to beginning treatment.     Drug/Food Interactions     Medication list reviewed in Epic. The patient was instructed to inform the care team before taking any new medications or supplements.   Drug interactions:   Diabetic medications:  As HCV is cleared, blood sugar control will change.  Cautioned to check BG regularly and monitor and report symptoms of hypoglycemia such as dizziness.  Encourage minimizing alcohol intake.:Denies alcohol  Potential interactions: Clearance of HCV infection with direct acting antivirals may lead to changes in hepatic function, which may impact the safe and effective use of concomitant medications. Frequent monitoring of relevant laboratory parameters (e.g., International Normalized Ratio [INR] in patients taking warfarin, blood glucose levels in diabetic patients) or drug concentrations of concomitant medications such as cytochrome P450 substrates with a narrow therapeutic index (e.g. certain immunosuppressants) is recommended to ensure safe and effective use. Dose adjustments of concomitant medications may be necessary.    Storage, Handling Precautions, & Disposal     Store this medication in the original container at room temperature.  Store in a dry place. Do not store in a bathroom.  Keep all drugs in a safe place. Keep all drugs out of the reach of children and pets.  Disposal: You should NOT have any Epclusa left over to throw away      Current Medications (including OTC/herbals), Comorbidities and Allergies     Current Outpatient Medications   Medication Sig Dispense Refill    baclofen (LIORESAL) 5 mg Tab tablet Take 1 tablet (5 mg total) by mouth.      BD NANO 2ND GEN PEN NEEDLE 32 gauge x 5/32 (4 mm) Ndle 4 TIMES A DAY      doxycycline (VIBRAMYCIN) 100 MG capsule PLEASE SEE ATTACHED FOR DETAILED DIRECTIONS      gabapentin (NEURONTIN) 300 MG capsule Take 1 capsule (300 mg total) by mouth.      glipiZIDE (GLUCOTROL) 10 MG tablet Take 0.5 tablets (5 mg total) by mouth Two (2) times a day (30 minutes before a meal).      insulin detemir U-100 (LEVEMIR) 100 unit/mL (3 mL) injection pen Inject 0.3 mL (30 Units total) under  the skin nightly.      insulin lispro (HUMALOG) 100 unit/mL injection pen Inject 15 Units under the skin four (4) times a day.      Lactobac. rhamnosus GG-inulin 10 billion cell -200 mg CpSP Take 1 capsule by mouth daily. 30 capsule 0    metFORMIN (GLUCOPHAGE) 1000 MG tablet Take 1 tablet (1,000 mg total) by mouth.      ONETOUCH DELICA PLUS LANCET 33 gauge Misc INJECT 1 EACH UNDER THE SKIN 4 TIMES A DAY      ONETOUCH ULTRA TEST Strp CHECK FOUR TIMES DAILY      sofosbuvir-velpatasvir (EPCLUSA) 400-100 mg tablet Take 1 tablet by mouth daily. 28 tablet 2     No current facility-administered medications for this visit.       No Known Allergies    Patient Active Problem List   Diagnosis    Hyperglycemia    Poorly controlled diabetes mellitus (CMS-HCC)    Chronic pain    Leg ulcer (CMS-HCC)    Cellulitis of lower extremity       Reviewed and up to date in Epic.    HCV Labs:   Latest Reference Range & Units 03/05/19 23:42 11/01/22 18:19 12/14/22 13:51   HCV RNA (IU) <=0 IU/mL 581,693 (H) 130,795 (H) 628,650 (H)     Appropriateness of Therapy     Acute infections noted within Epic:  No active infections  Patient reported infection: None    Is medication and dose appropriate based on diagnosis and infection status? Yes    Prescription has been clinically reviewed: Yes      Baseline Quality of Life Assessment      How many days over the past month did your Hepatitis C  keep you from your normal activities? For example, brushing your teeth or getting up in the morning. 0    Financial Information     Medication Assistance provided: Prior Authorization    Anticipated copay of $0.00 reviewed with patient. Verified delivery address.    Delivery Information     Scheduled delivery date: 12/25/22    Expected start date: 12/25/22      Medication will be delivered via Same Day Courier to the prescription address in [facility].  This shipment will not require a signature.      Explained the services we provide at [facility] and that each month we would call to set up refills.  Stressed importance of returning phone calls so that we could ensure they receive their medications in time each month.  Informed patient that we should be setting up refills 7-10 days prior to when they will run out of medication.  A pharmacist will reach out to perform a clinical assessment periodically.  Informed patient that a welcome packet, containing information about our pharmacy and other support services, a Notice of Privacy Practices, and a drug information handout will be sent.      The patient or caregiver noted above participated in the development of this care plan and knows that they can request review of or adjustments to the care plan at any time.      Patient or caregiver verbalized understanding of the above information as well as how to contact the pharmacy at [phone] option 4 with any questions/concerns.  The pharmacy is open Monday through Friday 8:30am-4:30pm.  A pharmacist is available 24/7 via pager to answer any clinical questions they may have.    Patient Specific Needs  Does the patient have any physical, cognitive, or cultural barriers? No    Does the patient have adequate living arrangements? (i.e. the ability to store and take their medication appropriately) Yes    Did you identify any home environmental safety or security hazards? No    Patient prefers to have medications discussed with  Patient     Is the patient or caregiver able to read and understand education materials at a high school level or above? No    Patient's primary language is  English     Is the patient high risk? No    SOCIAL DETERMINANTS OF HEALTH     At the [facility], we have learned that life circumstances - like trouble affording food, housing, utilities, or transportation can affect the health of many of our patients.   That is why we wanted to ask: are you currently experiencing any life circumstances that are negatively impacting your health and/or quality of life? Patient declined to answer    Social Determinants of Health     Financial Resource Strain: Low Risk  (10/31/2022)    Overall Financial Resource Strain (CARDIA)     Difficulty of Paying Living Expenses: Not hard at all   Internet Connectivity: Not on file   Food Insecurity: No Food Insecurity (10/31/2022)    Hunger Vital Sign     Worried About Running Out of Food in the Last Year: Never true     Ran Out of Food in the Last Year: Never true   Tobacco Use: High Risk (12/14/2022)    Patient History     Smoking Tobacco Use: Every Day     Smokeless Tobacco Use: Never     Passive Exposure: Not on file   Housing/Utilities: Low Risk  (10/31/2022)    Housing/Utilities     Within the past 12 months, have you ever stayed: outside, in a car, in a tent, in an overnight shelter, or temporarily in someone else's home (i.e. couch-surfing)?: No     Are you worried about losing your housing?: No     Within the past 12 months, have you been unable to get utilities (heat, electricity) when it was really needed?: No   Alcohol Use: Not At Risk (01/21/2018)    Received from [facility]    AUDIT-C     Frequency of Alcohol Consumption: Never     Average Number of Drinks: Not on file     Frequency of Binge Drinking: Not on file   Transportation Needs: No Transportation Needs (10/31/2022)    PRAPARE - Transportation     Lack of Transportation (Medical): No     Lack of Transportation (Non-Medical): No   Substance Use: Not on file   Health Literacy: Not on file   Physical Activity: Not on File (10/14/2020)    Received from [facility]     Physical Activity     Physical Activity: 0   Interpersonal Safety: Not on file   Stress: Not on File (10/14/2020)    Received from [facility]     Stress     Stress: 0   Intimate Partner Violence: Not on file   Depression: Not at risk (11/17/2022)    Received from [facility]    PHQ-2     Depression Risk: 1   Social Connections: Not on File (10/14/2020)    Received from [facility]     Social Connections     Social Connections and Isolation: 0  Would you be willing to receive help with any of the needs that you have identified today? Not applicable       Roderic Palau, PharmD  [facility] Specialty Pharmacist

## 2022-12-22 NOTE — Unmapped (Signed)
[facility] Specialty Medication Onboarding    Specialty Medication: sofosbuvir-velpatasvir 400-100 mg tablet (EPCLUSA)  Prior Authorization: Approved   Financial Assistance: No - copay  <$25  Final Copay/Day Supply: $0 / 28    Insurance Restrictions: None     Notes to Pharmacist:     The triage team has completed the benefits investigation and has determined that the patient is able to fill this medication at [facility]. Please contact the patient to complete the onboarding or follow up with the prescribing physician as needed.

## 2023-01-15 NOTE — Unmapped (Signed)
[name] Clinical Assessment & Refill Coordination Note    Brent Barajas, DOB: [birth year]  Phone: [phone] (home)     All above HIPAA information was verified with patient.     Was a Nurse, learning disability used for this call? No    Specialty Medication(s):   Infectious Disease: sofosbuvir/velpatasvir     Current Outpatient Medications   Medication Sig Dispense Refill    BD NANO 2ND GEN PEN NEEDLE 32 gauge x 5/32 (4 mm) Ndle 4 TIMES A DAY      glipiZIDE (GLUCOTROL) 10 MG tablet Take 0.5 tablets (5 mg total) by mouth Two (2) times a day (30 minutes before a meal).      insulin detemir U-100 (LEVEMIR) 100 unit/mL (3 mL) injection pen Inject 0.3 mL (30 Units total) under the skin nightly.      insulin lispro (HUMALOG) 100 unit/mL injection pen Inject 15 Units under the skin four (4) times a day.      metFORMIN (GLUCOPHAGE) 1000 MG tablet Take 1 tablet (1,000 mg total) by mouth.      ONETOUCH DELICA PLUS LANCET 33 gauge Misc INJECT 1 EACH UNDER THE SKIN 4 TIMES A DAY      ONETOUCH ULTRA TEST Strp CHECK FOUR TIMES DAILY      sofosbuvir-velpatasvir (EPCLUSA) 400-100 mg tablet Take 1 tablet by mouth daily. 28 tablet 2     No current facility-administered medications for this visit.        Changes to medications: Trevon reports no changes at this time.    No Known Allergies    Changes to allergies: No    SPECIALTY MEDICATION ADHERENCE     Sofosbuvir-velpatasvir 400-100 mg: 11 days of medicine on hand after today    Medication Adherence    Patient reported X missed doses in the last month: 2  Specialty Medication: sofosbuvir-velpatasvir 400-100 mg tablet (EPCLUSA): He reports missing 1 or 2  Patient is on additional specialty medications: No  Demonstrates understanding of importance of adherence: yes  Informant: patient  Provider-estimated medication adherence level: good  Patient is at risk for Non-Adherence: No        Specialty medication(s) dose(s) confirmed: Regimen is correct and unchanged.     Are there any concerns with adherence? No    Adherence counseling provided? Not needed    CLINICAL MANAGEMENT AND INTERVENTION      Clinical Benefit Assessment:    Do you feel the medicine is effective or helping your condition? Yes    Clinical Benefit counseling provided? Not needed    Adverse Effects Assessment:    Are you experiencing any side effects? Yes, patient reports experiencing sluggishness and has had one headache. Side effect counseling provided: I encouraged him to drink plenty of fluids to help with the sluggishness and headaches.  He reports drinking sugar free drinks daily.    Are you experiencing difficulty administering your medicine? No    Quality of Life Assessment:    How many days over the past month did your Hepatitis C  keep you from your normal activities? For example, brushing your teeth or getting up in the morning. 0    Have you discussed this with your provider? Not needed    Acute Infection Status:    Acute infections noted within Epic:  No active infections  Patient reported infection: None    Therapy Appropriateness:    Is therapy appropriate and patient progressing towards therapeutic goals? Yes, therapy is appropriate and  should be continued    DISEASE/MEDICATION-SPECIFIC INFORMATION      For Hepatitis C patients (clinical assessment):  Regimen: sofosbuvir-velpatasvir x 12 weeks  Therapy start date: He does not remember the day he started.  His start was delayed from the original anticipated start date of 12/25/22  Completed Treatment Week #: 2  What time of day do you take your medicine? In the afternoon after lunch  Pill count over the phone revealed #11 tablets which is inappropriate.    Are you taking any new OTC or herbal medication? No  Any alcoholic beverages? No  Do you have a follow up appointment? Yes, appointment is scheduled, patient is aware, and no identified barriers    Hepatitis C: Not Applicable    PATIENT SPECIFIC NEEDS     Does the patient have any physical, cognitive, or cultural barriers? No    Is the patient high risk? No    Did the patient require a clinical intervention? No    Does the patient require physician intervention or other additional services (i.e., nutrition, smoking cessation, social work)? No    SOCIAL DETERMINANTS OF HEALTH     At the [facility], we have learned that life circumstances - like trouble affording food, housing, utilities, or transportation can affect the health of many of our patients.   That is why we wanted to ask: are you currently experiencing any life circumstances that are negatively impacting your health and/or quality of life? Patient declined to answer    Social Determinants of Health     Financial Resource Strain: Low Risk  (10/31/2022)    Overall Financial Resource Strain (CARDIA)     Difficulty of Paying Living Expenses: Not hard at all   Internet Connectivity: Not on file   Food Insecurity: No Food Insecurity (10/31/2022)    Hunger Vital Sign     Worried About Running Out of Food in the Last Year: Never true     Ran Out of Food in the Last Year: Never true   Tobacco Use: High Risk (12/14/2022)    Patient History     Smoking Tobacco Use: Every Day     Smokeless Tobacco Use: Never     Passive Exposure: Not on file   Housing/Utilities: Low Risk  (10/31/2022)    Housing/Utilities     Within the past 12 months, have you ever stayed: outside, in a car, in a tent, in an overnight shelter, or temporarily in someone else's home (i.e. couch-surfing)?: No     Are you worried about losing your housing?: No     Within the past 12 months, have you been unable to get utilities (heat, electricity) when it was really needed?: No   Alcohol Use: Not At Risk (01/21/2018)    Received from [facility]    AUDIT-C     Frequency of Alcohol Consumption: Never     Average Number of Drinks: Not on file     Frequency of Binge Drinking: Not on file   Transportation Needs: No Transportation Needs (10/31/2022)    PRAPARE - Transportation     Lack of Transportation (Medical): No     Lack of Transportation (Non-Medical): No   Substance Use: Not on file   Health Literacy: Not on file   Physical Activity: Not on File (10/14/2020)    Received from [facility]     Physical Activity     Physical Activity: 0   Interpersonal Safety: Not on file  Stress: Not on File (10/14/2020)    Received from [facility]     Stress     Stress: 0   Intimate Partner Violence: Not on file   Depression: Not at risk (11/17/2022)    Received from [facility]    PHQ-2     Depression Risk: 1   Social Connections: Not on File (10/14/2020)    Received from [facility]     Social Connections     Social Connections and Isolation: 0       Would you be willing to receive help with any of the needs that you have identified today? Not applicable       SHIPPING     Specialty Medication(s) to be Shipped:   Infectious Disease: sofosbuvir/velpatasvir    Other medication(s) to be shipped: No additional medications requested for fill at this time     Changes to insurance: No    Delivery Scheduled: Yes, Expected medication delivery date: 01/18/23.     Medication will be delivered via Next Day Courier to the confirmed prescription address in [facility].    The patient will receive a drug information handout for each medication shipped and additional FDA Medication Guides as required.  Verified that patient has previously received a Conservation officer, historic buildings and a Surveyor, mining.    The patient or caregiver noted above participated in the development of this care plan and knows that they can request review of or adjustments to the care plan at any time.      All of the patient's questions and concerns have been addressed.    Roderic Palau, PharmD   [facility] Specialty Pharmacist

## 2023-01-17 MED FILL — SOFOSBUVIR 400 MG-VELPATASVIR 100 MG TABLET: ORAL | 28 days supply | Qty: 28 | Fill #1

## 2023-01-29 ENCOUNTER — Ambulatory Visit: Admit: 2023-01-29 | Payer: MEDICARE

## 2023-02-06 ENCOUNTER — Ambulatory Visit: Admit: 2023-02-06 | Payer: MEDICARE | Attending: Family | Primary: Family

## 2023-02-12 NOTE — Unmapped (Signed)
Specialty Medication(s): sofosbuvir-velpatasvir 400-100mg      Brent Barajas has been dis-enrolled from the [facility] specialty pharmacy services due to the upcoming therapy completion - expected therapy completion date: 03/17/23 .    Additional information provided to the patient: n/a    Brent Barajas, PharmD  [facility] Specialty Pharmacist

## 2023-02-12 NOTE — Unmapped (Signed)
[facility] Shared [facility] Clinical Assessment & Refill Coordination Note    Brent Barajas, DOB: 12-07-1980  Phone: [phone] (home)     All above HIPAA information was verified with patient.     Was a Nurse, learning disability used for this call? No    Specialty Medication(s):   Infectious Disease: sofosbuvir/velpatasvir     Current Outpatient Medications   Medication Sig Dispense Refill    oxyCODONE (ROXICODONE) 10 mg immediate release tablet Take 1 tablet (10 mg total) by mouth four (4) times a day as needed for pain.      BD NANO 2ND GEN PEN NEEDLE 32 gauge x 5/32 (4 mm) Ndle 4 TIMES A DAY      glipiZIDE (GLUCOTROL) 10 MG tablet Take 0.5 tablets (5 mg total) by mouth Two (2) times a day (30 minutes before a meal).      insulin detemir U-100 (LEVEMIR) 100 unit/mL (3 mL) injection pen Inject 0.3 mL (30 Units total) under the skin nightly.      insulin lispro (HUMALOG) 100 unit/mL injection pen Inject 15 Units under the skin four (4) times a day.      metFORMIN (GLUCOPHAGE) 1000 MG tablet Take 1 tablet (1,000 mg total) by mouth.      ONETOUCH DELICA PLUS LANCET 33 gauge Misc INJECT 1 EACH UNDER THE SKIN 4 TIMES A DAY      ONETOUCH ULTRA TEST Strp CHECK FOUR TIMES DAILY      sofosbuvir-velpatasvir (EPCLUSA) 400-100 mg tablet Take 1 tablet by mouth daily. 28 tablet 2     No current facility-administered medications for this visit.        Changes to medications: Trai reports starting the following medications: oxycodone    No Known Allergies    Changes to allergies: No    SPECIALTY MEDICATION ADHERENCE     Sofosbuvir-velpatasvir 400-100 mg: 9 days of medicine on hand       Medication Adherence    Patient reported X missed doses in the last month: 0  Specialty Medication: sofosbuvir-velpatasvir 400-100mg   Patient is on additional specialty medications: No  Any gaps in refill history greater than 2 weeks in the last 3 months: no  Demonstrates understanding of importance of adherence: yes  Informant: patient  Provider-estimated medication adherence level: good  Patient is at risk for Non-Adherence: No          Specialty medication(s) dose(s) confirmed: Regimen is correct and unchanged.     Are there any concerns with adherence? No    Adherence counseling provided? Not needed    CLINICAL MANAGEMENT AND INTERVENTION      Clinical Benefit Assessment:    Do you feel the medicine is effective or helping your condition? Yes    Clinical Benefit counseling provided? Not needed    Adverse Effects Assessment:    Are you experiencing any side effects? No    Are you experiencing difficulty administering your medicine? No    Quality of Life Assessment:    How many days over the past month did your Hepatitis C  keep you from your normal activities? For example, brushing your teeth or getting up in the morning. 0    Have you discussed this with your provider? Not needed    Acute Infection Status:    Acute infections noted within Epic:  No active infections  Patient reported infection: None    Therapy Appropriateness:    Is therapy appropriate and patient progressing towards therapeutic goals? Yes, therapy  is appropriate and should be continued    DISEASE/MEDICATION-SPECIFIC INFORMATION      For Hepatitis C patients (clinical assessment):  Regimen: sofosbuvir-velpatasvir x 12 weeks  Therapy start date: He does not remember the start date.  It was delayed from the original anticipated start date of 12/25/22  Completed Treatment Week #: 7  What time of day do you take your medicine? Between 10am and 12pm  Pill count over the phone revealed #9 tablets which is inappropriate.    Are you taking any new OTC or herbal medication? No  Any alcoholic beverages? No  Do you have a follow up appointment? No.  He missed his last appointment due to his roommate having a seizure.  He said he needs to call to reschedule    Hepatitis C: Not Applicable    PATIENT SPECIFIC NEEDS     Does the patient have any physical, cognitive, or cultural barriers? No    Is the patient high risk? No    Did the patient require a clinical intervention? No    Does the patient require physician intervention or other additional services (i.e., nutrition, smoking cessation, social work)? No    SOCIAL DETERMINANTS OF HEALTH     At the [facility], we have learned that life circumstances - like trouble affording food, housing, utilities, or transportation can affect the health of many of our patients.   That is why we wanted to ask: are you currently experiencing any life circumstances that are negatively impacting your health and/or quality of life? Patient declined to answer    Social Determinants of Health     Financial Resource Strain: Low Risk  (10/31/2022)    Overall Financial Resource Strain (CARDIA)     Difficulty of Paying Living Expenses: Not hard at all   Internet Connectivity: Not on file   Food Insecurity: No Food Insecurity (10/31/2022)    Hunger Vital Sign     Worried About Running Out of Food in the Last Year: Never true     Ran Out of Food in the Last Year: Never true   Tobacco Use: High Risk (12/14/2022)    Patient History     Smoking Tobacco Use: Every Day     Smokeless Tobacco Use: Never     Passive Exposure: Not on file   Housing/Utilities: Low Risk  (10/31/2022)    Housing/Utilities     Within the past 12 months, have you ever stayed: outside, in a car, in a tent, in an overnight shelter, or temporarily in someone else's home (i.e. couch-surfing)?: No     Are you worried about losing your housing?: No     Within the past 12 months, have you been unable to get utilities (heat, electricity) when it was really needed?: No   Alcohol Use: Not At Risk (01/21/2018)    Received from [facility]    AUDIT-C     Frequency of Alcohol Consumption: Never     Average Number of Drinks: Not on file     Frequency of Binge Drinking: Not on file   Transportation Needs: No Transportation Needs (10/31/2022)    PRAPARE - Transportation     Lack of Transportation (Medical): No     Lack of Transportation (Non-Medical): No   Substance Use: Not on file   Health Literacy: Not on file   Physical Activity: Not on File (10/14/2020)    Received from [facility]     Physical Activity  Physical Activity: 0   Interpersonal Safety: Not on file   Stress: Not on File (10/14/2020)    Received from [facility]     Stress     Stress: 0   Intimate Partner Violence: Not on file   Depression: Not at risk (11/17/2022)    Received from [facility]    PHQ-2     Depression Risk: 1   Social Connections: Not on File (10/14/2020)    Received from [facility]     Social Connections     Social Connections and Isolation: 0       Would you be willing to receive help with any of the needs that you have identified today? Not applicable       SHIPPING     Specialty Medication(s) to be Shipped:   Infectious Disease: sofosbuvir/velpatasvir    Other medication(s) to be shipped: No additional medications requested for fill at this time     Changes to insurance: No    Delivery Scheduled: Yes, Expected medication delivery date: 02/14/23.     Medication will be delivered via Next Day Courier to the confirmed prescription address in [facility].    The patient will receive a drug information handout for each medication shipped and additional FDA Medication Guides as required.  Verified that patient has previously received a Conservation officer, historic buildings and a Surveyor, mining.    The patient or caregiver noted above participated in the development of this care plan and knows that they can request review of or adjustments to the care plan at any time.      All of the patient's questions and concerns have been addressed.    Roderic Palau, PharmD   [facility] Shared [facility] Specialty Pharmacist

## 2023-02-16 MED FILL — SOFOSBUVIR 400 MG-VELPATASVIR 100 MG TABLET: ORAL | 28 days supply | Qty: 28 | Fill #2

## 2023-03-22 ENCOUNTER — Ambulatory Visit: Admit: 2023-03-22 | Payer: MEDICARE | Attending: Family | Primary: Family

## 2023-03-24 ENCOUNTER — Ambulatory Visit: Admit: 2023-03-24 | Discharge: 2023-03-24 | Discharge status: Against Medical Advice | Payer: MEDICARE

## 2023-03-24 DIAGNOSIS — Z5329 Procedure and treatment not carried out because of patient's decision for other reasons: Principal | ICD-10-CM

## 2023-03-31 ENCOUNTER — Ambulatory Visit: Admit: 2023-03-31 | Discharge: 2023-04-01 | Disposition: A | Discharge status: Home / Self Care | Payer: MEDICARE

## 2023-03-31 DIAGNOSIS — R739 Hyperglycemia, unspecified: Principal | ICD-10-CM

## 2023-03-31 DIAGNOSIS — F1910 Other psychoactive substance abuse, uncomplicated: Principal | ICD-10-CM

## 2023-04-17 ENCOUNTER — Ambulatory Visit: Admit: 2023-04-17 | Payer: MEDICARE | Attending: Family | Primary: Family

## 2023-08-16 ENCOUNTER — Ambulatory Visit: Admit: 2023-08-16 | Discharge: 2023-08-17 | Payer: MEDICARE

## 2023-08-16 DIAGNOSIS — M545 Lumbar pain: Principal | ICD-10-CM

## 2023-08-16 DIAGNOSIS — M25512 Pain in left shoulder: Principal | ICD-10-CM

## 2023-08-16 DIAGNOSIS — G8929 Other chronic pain: Principal | ICD-10-CM

## 2023-08-16 DIAGNOSIS — M542 Cervicalgia: Principal | ICD-10-CM

## 2023-08-16 DIAGNOSIS — M546 Pain in thoracic spine: Principal | ICD-10-CM

## 2023-11-26 ENCOUNTER — Ambulatory Visit: Admit: 2023-11-26

## 2023-11-26 ENCOUNTER — Inpatient Hospital Stay: Admit: 2023-11-26 | Discharge: 2023-12-06 | Disposition: A | Discharge status: Home / Self Care

## 2023-11-26 ENCOUNTER — Ambulatory Visit: Admit: 2023-11-26 | Discharge: 2023-12-06 | Disposition: A | Discharge status: Home / Self Care

## 2023-12-01 MED ORDER — LEVOFLOXACIN 750 MG TABLET
ORAL_TABLET | Freq: Every day | ORAL | 0 refills | 5.00 days | Status: CP
Start: 2023-12-01 — End: 2023-12-06

## 2023-12-01 MED ORDER — BENZONATATE 100 MG CAPSULE
ORAL_CAPSULE | Freq: Three times a day (TID) | ORAL | 0 refills | 7.00 days | Status: CP | PRN
Start: 2023-12-01 — End: 2023-12-08

## 2023-12-01 MED ORDER — NYSTATIN 100,000 UNIT/GRAM TOPICAL CREAM
Freq: Two times a day (BID) | TOPICAL | 0 refills | 0.00 days | Status: CP
Start: 2023-12-01 — End: 2024-11-30

## 2023-12-02 MED ORDER — FLUCONAZOLE 200 MG TABLET
ORAL_TABLET | Freq: Every day | ORAL | 0 refills | 5.00 days | Status: CP
Start: 2023-12-02 — End: 2023-12-07

## 2023-12-06 MED ORDER — FLUCONAZOLE 200 MG TABLET
ORAL_TABLET | Freq: Every day | ORAL | 0 refills | 3.00 days | Status: CP
Start: 2023-12-06 — End: 2023-12-09

## 2023-12-06 MED ORDER — BLOOD-GLUCOSE METER KIT WRAPPER
0 refills | 0.00 days | Status: CP
Start: 2023-12-06 — End: 2024-12-05

## 2023-12-06 MED ORDER — LINEZOLID 600 MG TABLET
ORAL_TABLET | Freq: Two times a day (BID) | ORAL | 0 refills | 7.00 days | Status: CP
Start: 2023-12-06 — End: 2023-12-13

## 2024-08-28 DIAGNOSIS — S81802A Unspecified open wound, left lower leg, initial encounter: Principal | ICD-10-CM

## 2024-08-28 DIAGNOSIS — S81801A Unspecified open wound, right lower leg, initial encounter: Principal | ICD-10-CM

## 2024-08-28 DIAGNOSIS — R739 Hyperglycemia, unspecified: Principal | ICD-10-CM

## 2024-08-28 MED ORDER — DOXYCYCLINE HYCLATE 100 MG CAPSULE
ORAL_CAPSULE | Freq: Two times a day (BID) | ORAL | 0 refills | 10.00000 days | Status: CP
Start: 2024-08-28 — End: 2024-08-28

## 2024-08-29 ENCOUNTER — Emergency Department
Admit: 2024-08-29 | Discharge: 2024-08-29 | Payer: Medicare (Managed Care) | Attending: Student in an Organized Health Care Education/Training Program

## 2024-09-19 ENCOUNTER — Ambulatory Visit: Admit: 2024-09-19
